# Patient Record
Sex: Female | Born: 1965 | Race: Black or African American | Hispanic: No | Marital: Married | Smoking: Never smoker
Health system: Southern US, Community
[De-identification: ages and names within clinical notes are randomized; demographics above are authoritative.]

## PROBLEM LIST (undated history)

## (undated) ENCOUNTER — Emergency Department (HOSPITAL_COMMUNITY): Admission: EM | Payer: Self-pay | Source: Home / Self Care

## (undated) DIAGNOSIS — I509 Heart failure, unspecified: Secondary | ICD-10-CM

## (undated) DIAGNOSIS — I10 Essential (primary) hypertension: Secondary | ICD-10-CM

## (undated) HISTORY — PX: CHOLECYSTECTOMY: SHX55

## (undated) HISTORY — PX: HERNIA REPAIR: SHX51

---

## 2000-07-25 ENCOUNTER — Other Ambulatory Visit: Admission: RE | Admit: 2000-07-25 | Discharge: 2000-07-25 | Payer: Self-pay | Admitting: Obstetrics

## 2006-09-25 ENCOUNTER — Emergency Department (HOSPITAL_COMMUNITY): Admission: EM | Admit: 2006-09-25 | Discharge: 2006-09-25 | Payer: Self-pay | Admitting: Emergency Medicine

## 2012-03-16 ENCOUNTER — Encounter (HOSPITAL_COMMUNITY): Payer: Self-pay | Admitting: *Deleted

## 2012-03-16 ENCOUNTER — Emergency Department (HOSPITAL_COMMUNITY)
Admission: EM | Admit: 2012-03-16 | Discharge: 2012-03-16 | Disposition: A | Discharge status: Home / Self Care | Payer: Self-pay | Source: Home / Self Care | Attending: Emergency Medicine | Admitting: Emergency Medicine

## 2012-03-16 DIAGNOSIS — I10 Essential (primary) hypertension: Secondary | ICD-10-CM

## 2012-03-16 DIAGNOSIS — R5381 Other malaise: Secondary | ICD-10-CM

## 2012-03-16 DIAGNOSIS — M7918 Myalgia, other site: Secondary | ICD-10-CM

## 2012-03-16 DIAGNOSIS — M545 Low back pain, unspecified: Secondary | ICD-10-CM

## 2012-03-16 DIAGNOSIS — N898 Other specified noninflammatory disorders of vagina: Secondary | ICD-10-CM

## 2012-03-16 DIAGNOSIS — N899 Noninflammatory disorder of vagina, unspecified: Secondary | ICD-10-CM

## 2012-03-16 LAB — POCT URINALYSIS DIP (DEVICE)
Bilirubin Urine: NEGATIVE
Ketones, ur: NEGATIVE mg/dL
Leukocytes, UA: NEGATIVE
Specific Gravity, Urine: 1.02 (ref 1.005–1.030)

## 2012-03-16 LAB — POCT PREGNANCY, URINE: Preg Test, Ur: NEGATIVE

## 2012-03-16 MED ORDER — LISINOPRIL-HYDROCHLOROTHIAZIDE 10-12.5 MG PO TABS: 1.0000 | ORAL_TABLET | Freq: Every day | ORAL | Status: DC

## 2012-03-16 MED ORDER — CYCLOBENZAPRINE HCL 5 MG PO TABS: 10.0000 mg | ORAL_TABLET | Freq: Three times a day (TID) | ORAL | Status: AC | PRN

## 2012-03-16 NOTE — ED Notes (Signed)
Pt with c/o urinary frequency and bilateral back /flank pain intermittently worse in am upon awakening - per pt recently used OTC med for yeast infection had discharge with odor -

## 2012-03-16 NOTE — ED Provider Notes (Signed)
History     CSN: [id number]  Arrival date & time 03/16/12  1215   First MD Initiated Contact with Patient 03/16/12 1449      Chief Complaint  Patient presents with  . Back Pain  . Urinary Frequency    (Consider location/radiation/quality/duration/timing/severity/associated sxs/prior treatment) HPI Comments: Pt with multiple complaints.  Knows bp has been high for a year, but doesn't have a pcp.  Requests anti-htn med.  Also c/o back pain for 3 months, denies injury, worst first thing in the morning, better after she gets up and moving.  Also c/o general malaise for 2-3 months, pt cannot be more specific, just c/o "feeling lousy".  Also c/o recurrent yeast infections, every 2 months or so, had one recently, tx self with otc monistat. Requests referral to [facility] hospital clinic. Pt wonders if has uti because of back pain and frequent vaginal sx.   Patient is a 46 y.o. female presenting with back pain. The history is provided by the patient.  Back Pain  This is a new problem. Episode onset: 3 months ago. The problem occurs daily. The problem has not changed since onset.The pain is associated with no known injury. The pain is present in the lumbar spine. The quality of the pain is described as aching. The pain does not radiate. The pain is mild. Worse during: worst in the morning. Stiffness is present in the morning. Pertinent negatives include no chest pain, no fever, no numbness, no headaches, no dysuria, no paresthesias, no tingling and no weakness. She has tried nothing for the symptoms. Risk factors include obesity and lack of exercise.    History reviewed. No pertinent past medical history.  History reviewed. No pertinent past surgical history.  History reviewed. No pertinent family history.  History  Substance Use Topics  . Smoking status: Never Smoker   . Smokeless tobacco: Not on file  . Alcohol Use: No    OB History    Grav Para Term Preterm Abortions TAB SAB Ect Mult  Living                  Review of Systems  Constitutional: Negative for fever, chills, activity change and appetite change.  Cardiovascular: Negative for chest pain.  Gastrointestinal: Positive for nausea. Negative for vomiting, diarrhea and constipation.       C/o nausea sometimes for last 2 weeks  Genitourinary: Positive for flank pain and vaginal discharge. Negative for dysuria, frequency, hematuria and vaginal bleeding.  Musculoskeletal: Positive for back pain.  Skin: Negative for color change and rash.  Neurological: Negative for tingling, weakness, numbness, headaches and paresthesias.    Allergies  Review of patient's allergies indicates no known allergies.  Home Medications   Current Outpatient Rx  Name Route Sig Dispense Refill  . CYCLOBENZAPRINE HCL 5 MG PO TABS Oral Take 2 tablets (10 mg total) by mouth 3 (three) times daily as needed for muscle spasms. 21 tablet 0  . LISINOPRIL-HYDROCHLOROTHIAZIDE 10-12.5 MG PO TABS Oral Take 1 tablet by mouth daily. 30 tablet 1    BP 170/89  Pulse 78  Temp 99.3 F (37.4 C) (Oral)  Resp 18  SpO2 99%  LMP 02/18/2012  Physical Exam  Constitutional: She appears well-developed and well-nourished. She does not appear ill. No distress.  Cardiovascular: Normal rate and regular rhythm.   Pulmonary/Chest: Effort normal and breath sounds normal.  Abdominal: Soft. Bowel sounds are normal. She exhibits no distension. There is generalized tenderness. There is no rigidity, no rebound,  no guarding and no CVA tenderness.       Mild generalized tender to palp  Genitourinary: There is no rash, tenderness or lesion on the right labia. There is no rash, tenderness or lesion on the left labia. Uterus is enlarged. Uterus is not tender. Cervix exhibits no motion tenderness and no discharge. Right adnexum displays no mass and no tenderness. Left adnexum displays no mass and no tenderness. No erythema, tenderness or bleeding around the vagina. Vaginal  discharge found.  Musculoskeletal:       Thoracic back: Normal.       Lumbar back: She exhibits tenderness. She exhibits normal range of motion, no bony tenderness, no swelling, no edema and no spasm.  Skin: Skin is warm, dry and intact.    ED Course  Procedures (including critical care time)  Labs Reviewed  POCT URINALYSIS DIP (DEVICE) - Abnormal; Notable for the following:    Hgb urine dipstick TRACE (*)     All other components within normal limits  POCT PREGNANCY, URINE  GC/CHLAMYDIA PROBE AMP, GENITAL  WET PREP, GENITAL   No results found.   1. Hypertension   2. Malaise   3. Vaginal irritation   4. Muscle pain, lumbar       MDM  Pt worried about her health, is seeking a pcp.  Multiple c/o.  I can find no source of her malaise, nor her low-grade temp.  Does not appear ill.  Rx lisinopril-hctz. [facility] infections.        Cathlyn Parsons, NP 03/16/12 2132

## 2012-03-19 NOTE — ED Provider Notes (Signed)
Medical screening examination/treatment/procedure(s) were performed by non-physician practitioner and as supervising physician I was immediately available for consultation/collaboration.  Luiz Blare MD   Luiz Blare, MD 03/19/12 [phone]

## 2014-06-14 ENCOUNTER — Encounter (HOSPITAL_COMMUNITY): Payer: Self-pay | Admitting: *Deleted

## 2014-06-14 ENCOUNTER — Inpatient Hospital Stay (HOSPITAL_COMMUNITY)
Admission: AD | Admit: 2014-06-14 | Discharge: 2014-06-14 | Disposition: A | Discharge status: Home / Self Care | Payer: Self-pay | Source: Ambulatory Visit | Attending: Family Medicine | Admitting: Family Medicine

## 2014-06-14 DIAGNOSIS — N898 Other specified noninflammatory disorders of vagina: Secondary | ICD-10-CM | POA: Insufficient documentation

## 2014-06-14 DIAGNOSIS — I10 Essential (primary) hypertension: Secondary | ICD-10-CM | POA: Insufficient documentation

## 2014-06-14 HISTORY — DX: Essential (primary) hypertension: I10

## 2014-06-14 LAB — WET PREP, GENITAL
CLUE CELLS WET PREP: NONE SEEN
TRICH WET PREP: NONE SEEN
Yeast Wet Prep HPF POC: NONE SEEN

## 2014-06-14 LAB — POCT PREGNANCY, URINE: Preg Test, Ur: NEGATIVE

## 2014-06-14 LAB — URINALYSIS, ROUTINE W REFLEX MICROSCOPIC
Bilirubin Urine: NEGATIVE
Glucose, UA: NEGATIVE mg/dL
Ketones, ur: NEGATIVE mg/dL
Leukocytes, UA: NEGATIVE
NITRITE: NEGATIVE
Protein, ur: NEGATIVE mg/dL
SPECIFIC GRAVITY, URINE: 1.025 (ref 1.005–1.030)
UROBILINOGEN UA: 0.2 mg/dL (ref 0.0–1.0)
pH: 5.5 (ref 5.0–8.0)

## 2014-06-14 LAB — URINE MICROSCOPIC-ADD ON

## 2014-06-14 LAB — HIV ANTIBODY (ROUTINE TESTING W REFLEX): HIV 1&2 Ab, 4th Generation: NONREACTIVE

## 2014-06-14 NOTE — MAU Note (Signed)
Patient states she has had a vaginal discharge off and on for 2-3 years. Uses OTC medication and will help for a while. Discharge has an odor, itching and burning.

## 2014-06-14 NOTE — MAU Provider Note (Signed)
History     CSN: [id number]  Arrival date and time: 06/14/14 [id number]   First Provider Initiated Contact with Patient 06/14/14 1153      Chief Complaint  Patient presents with  . Vaginal Discharge   HPI    Ms. Waymon Amatorina Y Gordin is a 48 y.o. female G3P3 who presents with vaginal discharge with an odor, she is also experiencing some vaginal irritation. She caught her husband of 28 years cheating yesterday and wants to be checked out for everything.   OB History    Gravida Para Term Preterm AB TAB SAB Ectopic Multiple Living   3         3      Past Medical History  Diagnosis Date  . Hypertension     Past Surgical History  Procedure Laterality Date  . Cholecystectomy    . Hernia repair      History reviewed. No pertinent family history.  History  Substance Use Topics  . Smoking status: Never Smoker   . Smokeless tobacco: Not on file  . Alcohol Use: No    Allergies: No Known Allergies  Prescriptions prior to admission  Medication Sig Dispense Refill Last Dose  . ibuprofen (ADVIL,MOTRIN) 800 MG tablet Take 800 mg by mouth every 8 (eight) hours as needed for mild pain.   Past Week at Unknown time  . lisinopril-hydrochlorothiazide (PRINZIDE,ZESTORETIC) 10-12.5 MG per tablet Take 1 tablet by mouth every other day. Due a dose today 06-14-14   06/12/2014 at Unknown time  . Norgestimate-Ethinyl Estradiol Triphasic (TRI-SPRINTEC) 0.18/0.215/0.25 MG-35 MCG tablet Take 1 tablet by mouth daily.   06/13/2014 at Unknown time  . lisinopril-hydrochlorothiazide (ZESTORETIC) 10-12.5 MG per tablet Take 1 tablet by mouth daily. (Patient not taking: Reported on 06/14/2014) 30 tablet 1    Results for orders placed or performed during the hospital encounter of 06/14/14 (from the past 48 hour(s))  Urinalysis, Routine w reflex microscopic     Status: Abnormal   Collection Time: 06/14/14  9:55 AM  Result Value Ref Range   Color, Urine YELLOW YELLOW   APPearance CLEAR CLEAR   Specific Gravity, Urine  1.025 1.005 - 1.030   pH 5.5 5.0 - 8.0   Glucose, UA NEGATIVE NEGATIVE mg/dL   Hgb urine dipstick TRACE (A) NEGATIVE   Bilirubin Urine NEGATIVE NEGATIVE   Ketones, ur NEGATIVE NEGATIVE mg/dL   Protein, ur NEGATIVE NEGATIVE mg/dL   Urobilinogen, UA 0.2 0.0 - 1.0 mg/dL   Nitrite NEGATIVE NEGATIVE   Leukocytes, UA NEGATIVE NEGATIVE  Urine microscopic-add on     Status: None   Collection Time: 06/14/14  9:55 AM  Result Value Ref Range   Squamous Epithelial / LPF RARE RARE   WBC, UA 0-2 <3 WBC/hpf   RBC / HPF 0-2 <3 RBC/hpf   Bacteria, UA RARE RARE  Pregnancy, urine POC     Status: None   Collection Time: 06/14/14 10:18 AM  Result Value Ref Range   Preg Test, Ur NEGATIVE NEGATIVE    Comment:        THE SENSITIVITY OF THIS METHODOLOGY IS >24 mIU/mL    Results for orders placed or performed during the hospital encounter of 06/14/14 (from the past 48 hour(s))  Urinalysis, Routine w reflex microscopic     Status: Abnormal   Collection Time: 06/14/14  9:55 AM  Result Value Ref Range   Color, Urine YELLOW YELLOW   APPearance CLEAR CLEAR   Specific Gravity, Urine 1.025 1.005 - 1.030  pH 5.5 5.0 - 8.0   Glucose, UA NEGATIVE NEGATIVE mg/dL   Hgb urine dipstick TRACE (A) NEGATIVE   Bilirubin Urine NEGATIVE NEGATIVE   Ketones, ur NEGATIVE NEGATIVE mg/dL   Protein, ur NEGATIVE NEGATIVE mg/dL   Urobilinogen, UA 0.2 0.0 - 1.0 mg/dL   Nitrite NEGATIVE NEGATIVE   Leukocytes, UA NEGATIVE NEGATIVE  Urine microscopic-add on     Status: None   Collection Time: 06/14/14  9:55 AM  Result Value Ref Range   Squamous Epithelial / LPF RARE RARE   WBC, UA 0-2 <3 WBC/hpf   RBC / HPF 0-2 <3 RBC/hpf   Bacteria, UA RARE RARE  Pregnancy, urine POC     Status: None   Collection Time: 06/14/14 10:18 AM  Result Value Ref Range   Preg Test, Ur NEGATIVE NEGATIVE    Comment:        THE SENSITIVITY OF THIS METHODOLOGY IS >24 mIU/mL   Wet prep, genital     Status: Abnormal   Collection Time: 06/14/14  12:00 PM  Result Value Ref Range   Yeast Wet Prep HPF POC NONE SEEN NONE SEEN   Trich, Wet Prep NONE SEEN NONE SEEN   Clue Cells Wet Prep HPF POC NONE SEEN NONE SEEN   WBC, Wet Prep HPF POC FEW (A) NONE SEEN    Comment: FEW BACTERIA SEEN    Review of Systems  Constitutional: Negative for fever and chills.  Gastrointestinal: Positive for nausea. Negative for vomiting and abdominal pain.  Genitourinary: Negative for dysuria, urgency and frequency.       + vaginal discharge   Physical Exam   Blood pressure 136/75, pulse 92, temperature 98.7 F (37.1 C), temperature source Oral, resp. rate 18, height 5\' 2"  (1.575 m), weight 87.272 kg (192 lb 6.4 oz), last menstrual period 05/31/2014, SpO2 99 %.  Physical Exam  Constitutional: She is oriented to person, place, and time. She appears well-developed and well-nourished. No distress.  HENT:  Head: Normocephalic.  Eyes: Pupils are equal, round, and reactive to light.  Neck: Neck supple.  Respiratory: Effort normal.  GI: Soft. She exhibits no distension. There is no tenderness. There is no rebound.  Genitourinary:  Speculum exam: Vagina - Small amount of creamy. Mucoid discharge, no odor Cervix - No contact bleeding, no active bleeding  Bimanual exam: Cervix closed, no CMT  Uterus non tender, normal size Adnexa non tender, no masses bilaterally GC/Chlam, wet prep done Chaperone present for exam.   Neurological: She is alert and oriented to person, place, and time.  Skin: Skin is warm. She is not diaphoretic.  Psychiatric: Her behavior is normal.    MAU Course  Procedures. None  MDM UA Upt Wet prep GC HIV   Assessment and Plan   A: 1. Vaginal discharge    P: Discharge home in stable condition GC pending Condoms always Support offered  HIV pending   Iona HansenJennifer Irene Niharika Savino, NP 06/14/2014 4:02 PM

## 2014-06-15 LAB — GC/CHLAMYDIA PROBE AMP
CT Probe RNA: NEGATIVE
GC Probe RNA: NEGATIVE

## 2015-04-05 ENCOUNTER — Other Ambulatory Visit: Payer: Self-pay | Admitting: Internal Medicine

## 2015-05-06 ENCOUNTER — Other Ambulatory Visit: Payer: Self-pay | Admitting: Internal Medicine

## 2015-05-06 DIAGNOSIS — Z1231 Encounter for screening mammogram for malignant neoplasm of breast: Secondary | ICD-10-CM

## 2015-05-13 ENCOUNTER — Ambulatory Visit
Admission: RE | Admit: 2015-05-13 | Discharge: 2015-05-13 | Disposition: A | Discharge status: Home / Self Care | Payer: No Typology Code available for payment source | Source: Ambulatory Visit | Attending: Internal Medicine | Admitting: Internal Medicine

## 2015-05-13 DIAGNOSIS — Z1231 Encounter for screening mammogram for malignant neoplasm of breast: Secondary | ICD-10-CM

## 2015-05-17 ENCOUNTER — Other Ambulatory Visit: Payer: Self-pay | Admitting: Internal Medicine

## 2015-05-17 DIAGNOSIS — R928 Other abnormal and inconclusive findings on diagnostic imaging of breast: Secondary | ICD-10-CM

## 2015-05-25 ENCOUNTER — Ambulatory Visit
Admission: RE | Admit: 2015-05-25 | Discharge: 2015-05-25 | Disposition: A | Discharge status: Home / Self Care | Payer: No Typology Code available for payment source | Source: Ambulatory Visit | Attending: Internal Medicine | Admitting: Internal Medicine

## 2015-05-25 DIAGNOSIS — R928 Other abnormal and inconclusive findings on diagnostic imaging of breast: Secondary | ICD-10-CM

## 2016-07-13 ENCOUNTER — Other Ambulatory Visit: Payer: Self-pay | Admitting: Internal Medicine

## 2016-07-13 DIAGNOSIS — Z1231 Encounter for screening mammogram for malignant neoplasm of breast: Secondary | ICD-10-CM

## 2016-07-17 IMAGING — US US BREAST LTD UNI LEFT INC AXILLA
1 series · 4 of 4 positions shown · non-contrast
Comparison: 05/13/2015 baseline screening mammogram

CLINICAL DATA: 48-year-old female with possible left breast mass on
screening mammogram.

EXAM:
DIGITAL DIAGNOSTIC LEFT MAMMOGRAM WITH 3D TOMOSYNTHESIS WITH CAD
ULTRASOUND LEFT BREAST

[Series 1: advbreast · 4 of 4 slices shown]
[im 1/4]
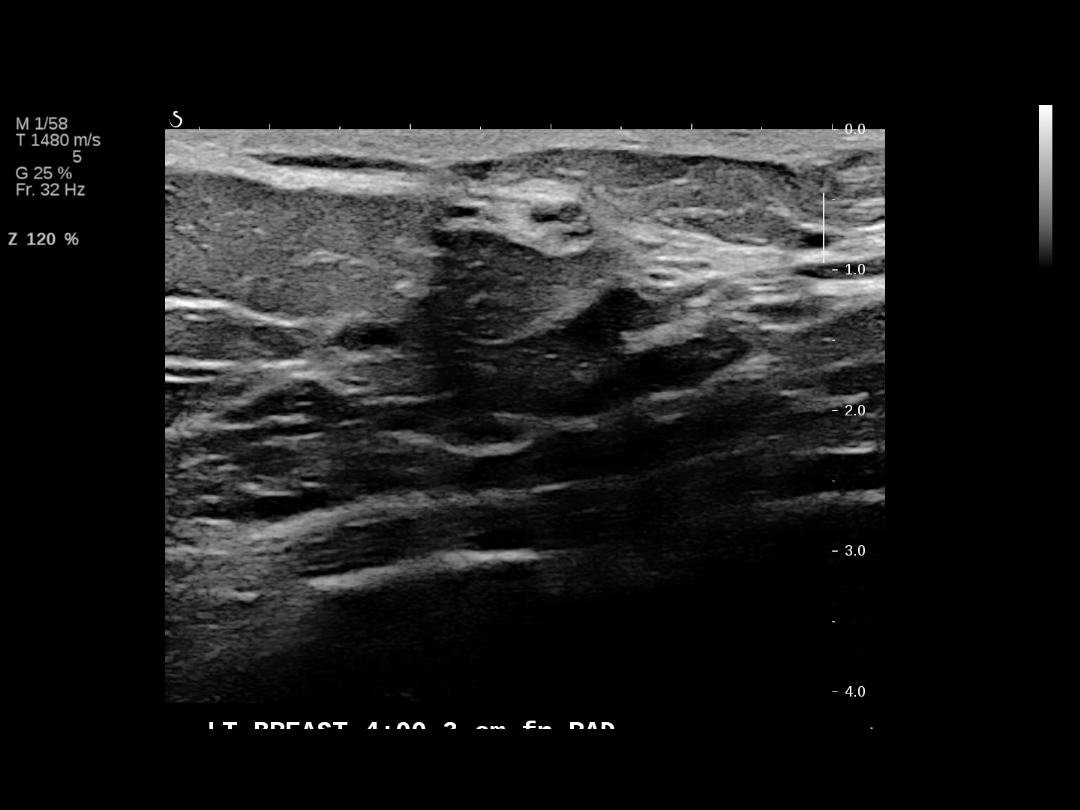
[im 2/4]
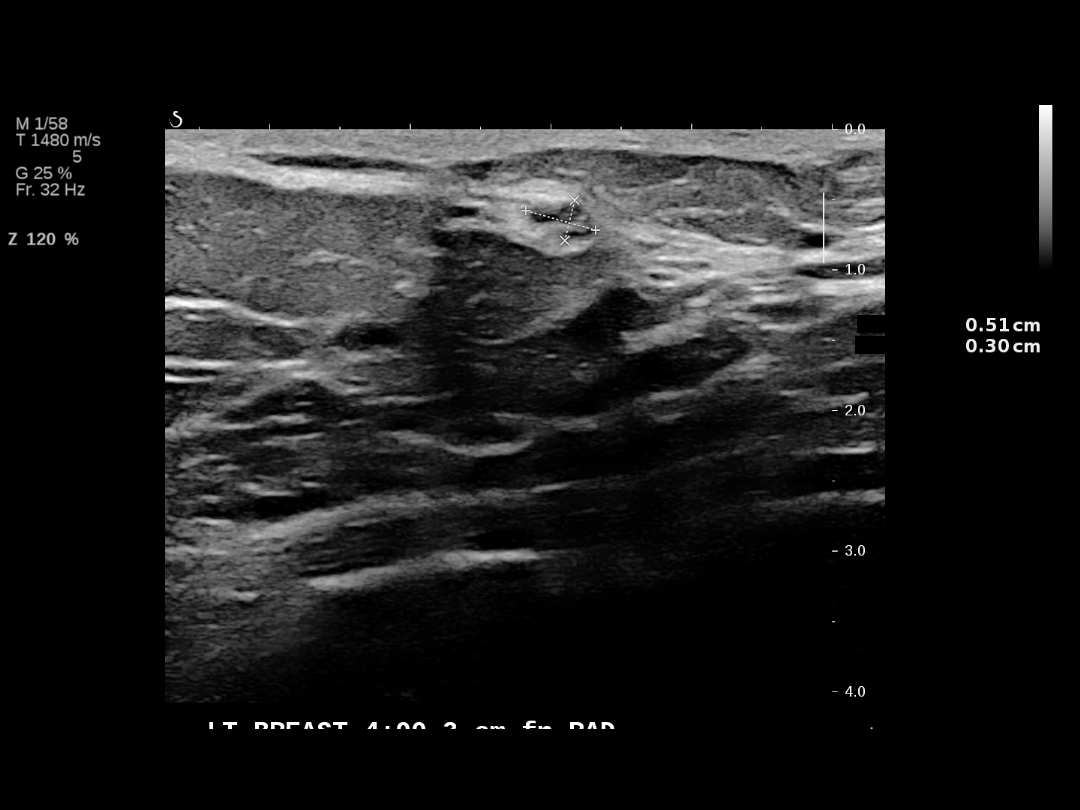
[im 3/4]
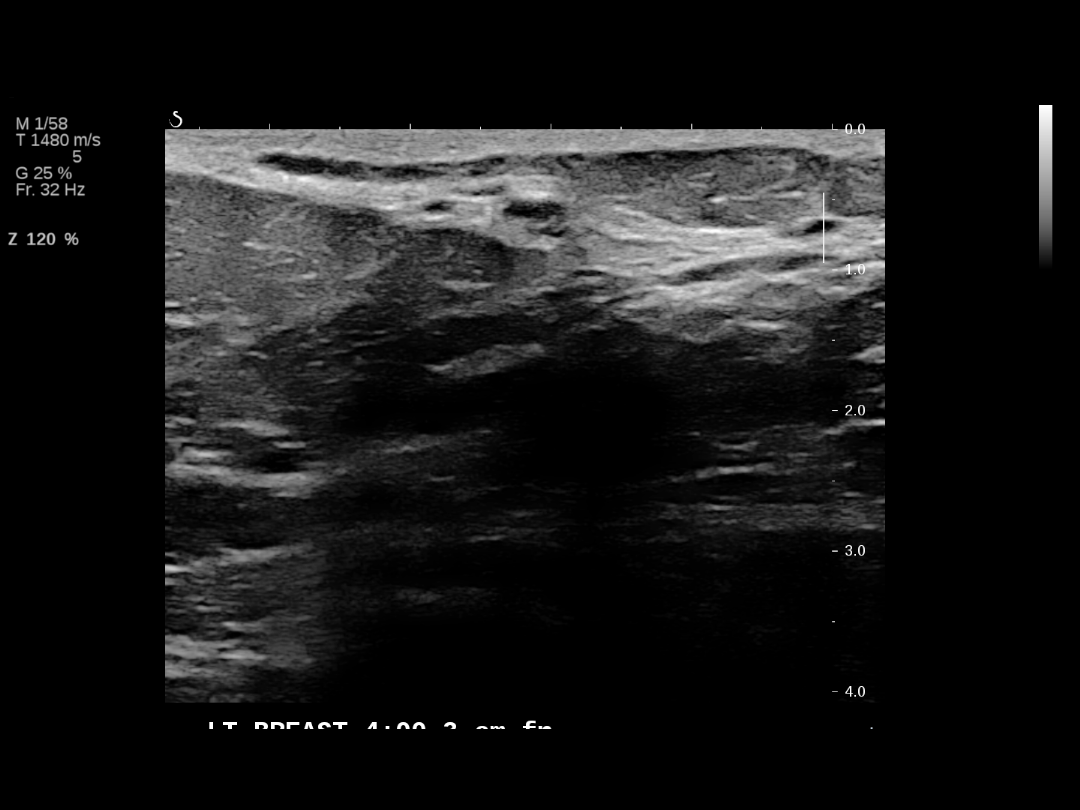
[im 4/4]
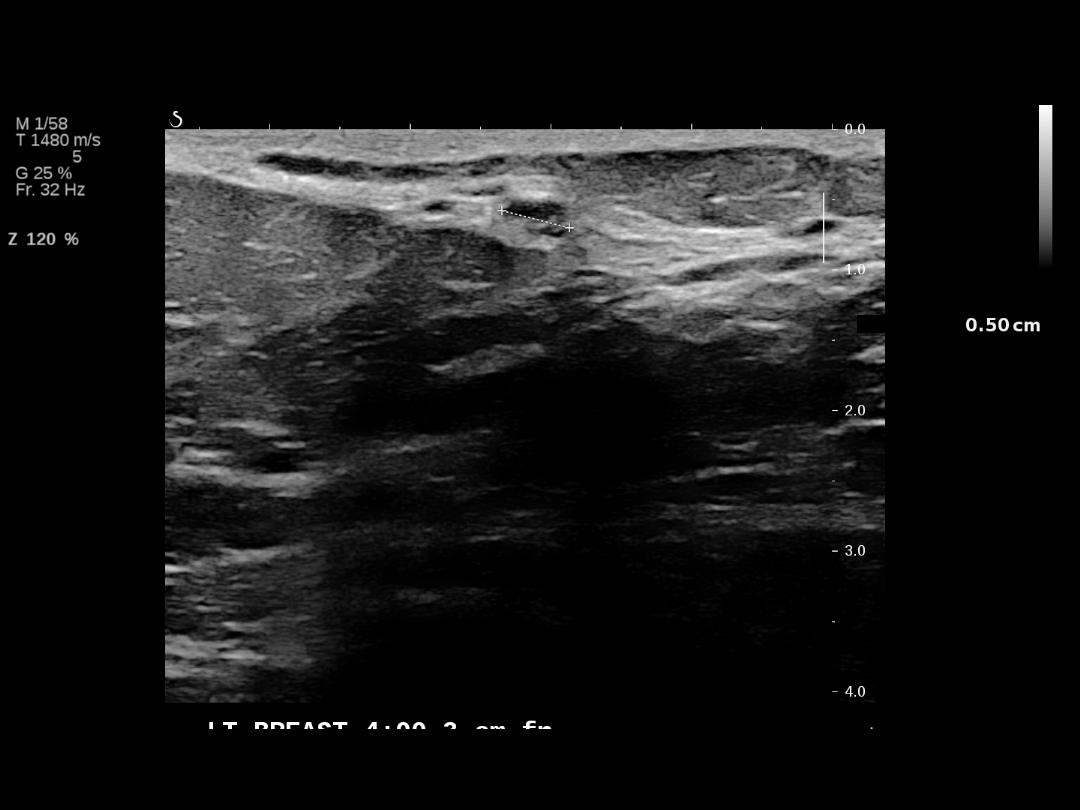

[4 of 4 positions shown; findings below may reference images not displayed]

ACR Breast Density Category b: There are scattered areas of
fibroglandular density.
FINDINGS: 2D and 3D views of the left breast demonstrate a circumscribed oval
low-density mass with possible fatty hilum within the lower outer
left breast.

No other mass, distortion or worrisome calcifications are
identified.

Mammographic images were processed with CAD.

On physical exam, no palpable abnormalities are identified within
the outer or lower left breast.

Targeted ultrasound is performed, showing a 5 x 3 x 5 mm normal
intraparenchymal lymph node at the 4 o'clock position of the left
breast 3 cm from the nipple, corresponding to the screening study
finding. No other solid or cystic mass, distortion or abnormal areas
of shadowing are identified within the outer or lower left breast.
IMPRESSION: Benign intraparenchymal lymph node in the outer slightly lower left
breast corresponding to the screening study finding.

No other mammographic abnormalities identified.

RECOMMENDATION:
Bilateral screening mammograms in 1 year.

I have discussed the findings and recommendations with the patient.
Results were also provided in writing at the conclusion of the
visit. If applicable, a reminder letter will be sent to the patient
regarding the next appointment.

BI-RADS CATEGORY  2: Benign.

## 2016-08-15 ENCOUNTER — Ambulatory Visit
Admission: RE | Admit: 2016-08-15 | Discharge: 2016-08-15 | Disposition: A | Discharge status: Home / Self Care | Payer: No Typology Code available for payment source | Source: Ambulatory Visit | Attending: Internal Medicine | Admitting: Internal Medicine

## 2016-08-15 DIAGNOSIS — Z1231 Encounter for screening mammogram for malignant neoplasm of breast: Secondary | ICD-10-CM

## 2018-11-07 ENCOUNTER — Emergency Department (HOSPITAL_COMMUNITY): Payer: Self-pay

## 2018-11-07 ENCOUNTER — Encounter (HOSPITAL_COMMUNITY): Payer: Self-pay | Admitting: Emergency Medicine

## 2018-11-07 ENCOUNTER — Other Ambulatory Visit: Payer: Self-pay

## 2018-11-07 ENCOUNTER — Emergency Department (HOSPITAL_COMMUNITY)
Admission: EM | Admit: 2018-11-07 | Discharge: 2018-11-07 | Disposition: A | Discharge status: Home / Self Care | Payer: Self-pay | Attending: Emergency Medicine | Admitting: Emergency Medicine

## 2018-11-07 DIAGNOSIS — W010XXA Fall on same level from slipping, tripping and stumbling without subsequent striking against object, initial encounter: Secondary | ICD-10-CM | POA: Insufficient documentation

## 2018-11-07 DIAGNOSIS — I10 Essential (primary) hypertension: Secondary | ICD-10-CM | POA: Insufficient documentation

## 2018-11-07 DIAGNOSIS — Y929 Unspecified place or not applicable: Secondary | ICD-10-CM | POA: Insufficient documentation

## 2018-11-07 DIAGNOSIS — Y9389 Activity, other specified: Secondary | ICD-10-CM | POA: Insufficient documentation

## 2018-11-07 DIAGNOSIS — Y998 Other external cause status: Secondary | ICD-10-CM | POA: Insufficient documentation

## 2018-11-07 DIAGNOSIS — S52502A Unspecified fracture of the lower end of left radius, initial encounter for closed fracture: Secondary | ICD-10-CM | POA: Insufficient documentation

## 2018-11-07 DIAGNOSIS — Z79899 Other long term (current) drug therapy: Secondary | ICD-10-CM | POA: Insufficient documentation

## 2018-11-07 MED ORDER — HYDROCODONE-ACETAMINOPHEN 5-325 MG PO TABS: 1.0000 | ORAL_TABLET | Freq: Four times a day (QID) | ORAL | 0 refills | Status: DC | PRN

## 2018-11-07 MED ORDER — ONDANSETRON HCL 4 MG PO TABS: 4.0000 mg | ORAL_TABLET | Freq: Four times a day (QID) | ORAL | 0 refills | Status: DC

## 2018-11-07 NOTE — ED Notes (Signed)
Patient verbalizes understanding of discharge instructions. Opportunity for questioning and answers were provided. Armband removed by staff, pt discharged from ED. Pt taken home by family.  

## 2018-11-07 NOTE — ED Notes (Signed)
Family contact information: [phone] (Son)

## 2018-11-07 NOTE — ED Notes (Signed)
Ortho paged for splint 

## 2018-11-07 NOTE — ED Triage Notes (Signed)
Pt arrives with c/o of left wrist after falling off of deck while working with bricks. Pt has pain and swelling to left wrist.

## 2018-11-07 NOTE — ED Provider Notes (Signed)
[facility] EMERGENCY DEPARTMENT Provider Note   CSN: [id number] Arrival date & time: 11/07/18  1635    History   Chief Complaint Chief Complaint  Patient presents with  . Wrist Injury    HPI Dana Mcintyre is a 53 y.o. female.     HPI Patient presents after fall.  Was working on paper brakes when she was standing on 1 to compress it and slipped and fell landing on her left wrist.  Also landed on her right buttock.  Complaining mostly pain in her left wrist.  States she felt like if she is going to pass out after the pain but feels much better now.  No numbness or weakness.  No elbow pain.  No head or neck pain.  Not on anticoagulation.  Otherwise healthy. Past Medical History:  Diagnosis Date  . Hypertension     There are no active problems to display for this patient.   Past Surgical History:  Procedure Laterality Date  . CHOLECYSTECTOMY    . HERNIA REPAIR       OB History    Gravida  3   Para  3   Term      Preterm      AB      Living  3     SAB      TAB      Ectopic      Multiple      Live Births               Home Medications    Prior to Admission medications   Medication Sig Start Date End Date Taking? Authorizing Provider  HYDROcodone-acetaminophen (NORCO/VICODIN) 5-325 MG tablet Take 1-2 tablets by mouth every 6 (six) hours as needed. 11/07/18   Benjiman Core, MD  ibuprofen (ADVIL,MOTRIN) 800 MG tablet Take 800 mg by mouth every 8 (eight) hours as needed for mild pain.    [provider]  lisinopril-hydrochlorothiazide (PRINZIDE,ZESTORETIC) 10-12.5 MG per tablet Take 1 tablet by mouth every other day. Due a dose today 06-14-14    [provider]  lisinopril-hydrochlorothiazide (ZESTORETIC) 10-12.5 MG per tablet Take 1 tablet by mouth daily. Patient not taking: Reported on 06/14/2014 03/16/12 03/16/13  Cathlyn Parsons, NP  Norgestimate-Ethinyl Estradiol Triphasic (TRI-SPRINTEC) 0.18/0.215/0.25 MG-35 MCG  tablet Take 1 tablet by mouth daily.    [provider]    Family History No family history on file.  Social History Social History   Tobacco Use  . Smoking status: Never Smoker  Substance Use Topics  . Alcohol use: No  . Drug use: No     Allergies   Patient has no known allergies.   Review of Systems Review of Systems  Constitutional: Negative for appetite change.  Respiratory: Negative for shortness of breath.   Cardiovascular: Negative for chest pain.  Gastrointestinal: Negative for abdominal distention.  Genitourinary: Negative for flank pain.  Musculoskeletal:       Left wrist pain.  Mild right low back pain.  Skin: Negative for wound.  Neurological: Negative for weakness and numbness.  Hematological: Negative for adenopathy.  Psychiatric/Behavioral: Negative for confusion.     Physical Exam Updated Vital Signs BP (!) 154/93 (BP Location: Right Arm)   Pulse 72   Temp 98.6 F (37 C) (Oral)   Resp 16   SpO2 100%   Physical Exam Vitals signs and nursing note reviewed.  HENT:     Head: Atraumatic.  Eyes:     Pupils:  Pupils are equal, round, and reactive to light.  Neck:     Musculoskeletal: Neck supple.     Comments: No midline tenderness.  Painless range of motion. Cardiovascular:     Rate and Rhythm: Normal rate.  Pulmonary:     Effort: No respiratory distress.  Abdominal:     Tenderness: There is no abdominal tenderness.  Musculoskeletal:        General: Tenderness present.     Comments: Tenderness over left wrist over the distal radius.  No elbow tenderness.  Good range of motion in the elbow.  No shoulder tenderness.  Neurovascular intact over radial median and ulnar distributions in the hand.  Strong radial pulse.  Skin:    Capillary Refill: Capillary refill takes less than 2 seconds.  Neurological:     General: No focal deficit present.     Mental Status: She is alert.      ED Treatments / Results  Labs (all labs ordered are  listed, but only abnormal results are displayed) Labs Reviewed - No data to display  EKG None  Radiology Dg Wrist Complete Left  Result Date: 11/07/2018 CLINICAL DATA:  Larey Seat with pain and deformity. EXAM: LEFT WRIST - COMPLETE 3+ VIEW COMPARISON:  None. FINDINGS: Comminuted fracture of the distal radial metaphysis but without significant angulation or displacement. Distal radial articular surface retains a slight volar tilt. No fracture of the ulna or carpal bones. The fracture line does not visibly extend to the articular surface. IMPRESSION: Mildly comminuted distal radial metaphyseal fracture. Fracture line does not visibly extend to the articular surface. Distal radial articular surface retains slight volar tilt. Electronically Signed   By: Paulina Fusi M.D.   On: 11/07/2018 17:22    Procedures Procedures (including critical care time)  Medications Ordered in ED Medications - No data to display   Initial Impression / Assessment and Plan / ED Course  I have reviewed the triage vital signs and the nursing notes.  Pertinent labs & imaging results that were available during my care of the patient were reviewed by me and considered in my medical decision making (see chart for details).       Patient with fall with distal radius fracture.  Is somewhat comminuted but appears well aligned.  Will splint with sugar tong splint and have follow-up with hand surgery.  Closed.  Final Clinical Impressions(s) / ED Diagnoses   Final diagnoses:  Closed fracture of distal end of left radius, unspecified fracture morphology, initial encounter    ED Discharge Orders         Ordered    HYDROcodone-acetaminophen (NORCO/VICODIN) 5-325 MG tablet  Every 6 hours PRN     11/07/18 1747           Benjiman Core, MD 11/07/18 1754

## 2018-11-07 NOTE — Progress Notes (Signed)
Orthopedic Tech Progress Note Patient Details:  Dana Mcintyre [birth year] [id number]  Ortho Devices Type of Ortho Device: Arm sling, Sugartong splint Ortho Device/Splint Location: lue Ortho Device/Splint Interventions: Ordered, Application, Adjustment   Post Interventions Patient Tolerated: Well Instructions Provided: Care of device, Adjustment of device   Trinna Post 11/07/2018, 7:13 PM

## 2018-11-07 NOTE — ED Notes (Signed)
Ortho at the bedside.

## 2019-10-16 ENCOUNTER — Ambulatory Visit: Payer: Self-pay

## 2020-01-19 ENCOUNTER — Other Ambulatory Visit: Payer: Self-pay | Admitting: Family Medicine

## 2020-01-19 ENCOUNTER — Other Ambulatory Visit (HOSPITAL_COMMUNITY)
Admission: RE | Admit: 2020-01-19 | Discharge: 2020-01-19 | Disposition: A | Discharge status: Home / Self Care | Payer: Self-pay | Source: Ambulatory Visit | Attending: Family Medicine | Admitting: Family Medicine

## 2020-01-19 DIAGNOSIS — Z124 Encounter for screening for malignant neoplasm of cervix: Secondary | ICD-10-CM | POA: Insufficient documentation

## 2020-01-21 LAB — CYTOLOGY - PAP
Comment: NEGATIVE
Diagnosis: NEGATIVE
High risk HPV: NEGATIVE

## 2020-03-06 DIAGNOSIS — E852 Heredofamilial amyloidosis, unspecified: Secondary | ICD-10-CM

## 2020-03-06 HISTORY — DX: Heredofamilial amyloidosis, unspecified: E85.2

## 2020-04-22 ENCOUNTER — Telehealth (HOSPITAL_COMMUNITY): Payer: Self-pay | Admitting: *Deleted

## 2020-04-22 DIAGNOSIS — Z1589 Genetic susceptibility to other disease: Secondary | ICD-10-CM

## 2020-04-22 NOTE — Telephone Encounter (Signed)
Received referral from [facility]. Pt's father is a pt of Dr Alford Highland and has ATTR, pt also tested + TTR.  Per Dr Shirlee Latch pt needs echo and appt with him, order placed will sch

## 2020-06-10 ENCOUNTER — Encounter (HOSPITAL_COMMUNITY): Payer: Self-pay | Admitting: Cardiology

## 2020-06-10 ENCOUNTER — Ambulatory Visit (HOSPITAL_BASED_OUTPATIENT_CLINIC_OR_DEPARTMENT_OTHER)
Admission: RE | Admit: 2020-06-10 | Discharge: 2020-06-10 | Disposition: A | Discharge status: Home / Self Care | Payer: Self-pay | Source: Ambulatory Visit | Attending: Cardiology | Admitting: Cardiology

## 2020-06-10 ENCOUNTER — Ambulatory Visit (HOSPITAL_COMMUNITY)
Admission: RE | Admit: 2020-06-10 | Discharge: 2020-06-10 | Disposition: A | Discharge status: Home / Self Care | Payer: Self-pay | Source: Ambulatory Visit | Attending: Cardiology | Admitting: Cardiology

## 2020-06-10 ENCOUNTER — Other Ambulatory Visit: Payer: Self-pay

## 2020-06-10 VITALS — BP 130/80 | HR 57 | Wt 189.0 lb

## 2020-06-10 DIAGNOSIS — I110 Hypertensive heart disease with heart failure: Secondary | ICD-10-CM | POA: Insufficient documentation

## 2020-06-10 DIAGNOSIS — Z1589 Genetic susceptibility to other disease: Secondary | ICD-10-CM

## 2020-06-10 DIAGNOSIS — I509 Heart failure, unspecified: Secondary | ICD-10-CM | POA: Insufficient documentation

## 2020-06-10 DIAGNOSIS — E785 Hyperlipidemia, unspecified: Secondary | ICD-10-CM | POA: Insufficient documentation

## 2020-06-10 DIAGNOSIS — Z79899 Other long term (current) drug therapy: Secondary | ICD-10-CM | POA: Insufficient documentation

## 2020-06-10 DIAGNOSIS — I10 Essential (primary) hypertension: Secondary | ICD-10-CM

## 2020-06-10 HISTORY — DX: Heart failure, unspecified: I50.9

## 2020-06-10 LAB — BASIC METABOLIC PANEL
Anion gap: 5 (ref 5–15)
BUN: 15 mg/dL (ref 6–20)
CO2: 25 mmol/L (ref 22–32)
Calcium: 9.9 mg/dL (ref 8.9–10.3)
Chloride: 111 mmol/L (ref 98–111)
Creatinine, Ser: 1.46 mg/dL — ABNORMAL HIGH (ref 0.44–1.00)
GFR, Estimated: 43 mL/min — ABNORMAL LOW (ref >60)
Glucose, Bld: 101 mg/dL — ABNORMAL HIGH (ref 70–99)
Potassium: 3.8 mmol/L (ref 3.5–5.1)
Sodium: 141 mmol/L (ref 135–145)

## 2020-06-10 LAB — ECHOCARDIOGRAM COMPLETE
Area-P 1/2: 3.08 cm2
S' Lateral: 2.5 cm

## 2020-06-10 NOTE — Patient Instructions (Addendum)
Your physician recommends that you schedule a follow-up appointment in: November 2023 with an echocardiogram  Your physician has requested that you have an echocardiogram. Echocardiography is a painless test that uses sound waves to create images of your heart. It provides your doctor with information about the size and shape of your heart and how well your heart's chambers and valves are working. This procedure takes approximately one hour. There are no restrictions for this procedure.   Labs done today, your results will be available in MyChart, we will contact you for abnormal readings.  If you have any questions or concerns before your next appointment please send [place] a message through [place] or call our office at [phone].    TO LEAVE A MESSAGE FOR THE NURSE SELECT OPTION 2, PLEASE LEAVE A MESSAGE INCLUDING: . YOUR NAME . DATE OF BIRTH . CALL BACK NUMBER . REASON FOR CALL**this is important as we prioritize the call backs  YOU WILL RECEIVE A CALL BACK THE SAME DAY AS LONG AS YOU CALL BEFORE 4:00 PM

## 2020-06-10 NOTE — Progress Notes (Signed)
Echocardiogram 2D Echocardiogram has been performed.  Warren Lacy Aayan Haskew 06/10/2020, 10:50 AM

## 2020-06-12 NOTE — Progress Notes (Signed)
PCP: Aliene Beams, MD Cardiology: Dr. Shirlee Latch  54 y.o. 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  Patient's father had hereditary TTR cardiac amyloidosis and was a heterozygote for Val142Ile.  Patient has been doing well symptomatically.  Her BP is controlled on amlodipine.  She jogs for about 1 hr/day and has been working on losing weight.  No chest pain.  No significant exertional dyspnea.  She does not have tingling or numbness in hands or feet.  No lightheadedness or palpitations.   Echo was done today and reviewed, EF 60-65% with normal diastolic function and strain, no LVH.   ECG (personally reviewed): NSR, normal.   PMH: 1. HTN: Angioedema with lisinopril.  2. Depression 3. Hyperlipidemia 4. 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 - Echo (11/21): EF 60-65%, no LVH, GLS -18.7%, normal diastolic function, normal RV.   Family history: Father with genetic TTR cardiac amyloidosis.   Social History   Socioeconomic History  . Marital status: Married    Spouse name: Not on file  . Number of children: Not on file  . Years of education: Not on file  . Highest education level: Not on file  Occupational History  . Not on file  Tobacco Use  . Smoking status: Never Smoker  Substance and Sexual Activity  . Alcohol use: No  . Drug use: No  . Sexual activity: Yes    Birth control/protection: Pill  Other Topics Concern  . Not on file  Social History Narrative  . Not on file   Social Determinants of Health   Financial Resource Strain:   . Difficulty of Paying Living Expenses: Not on file  Food Insecurity:   . Worried About Programme researcher, broadcasting/film/video in the Last Year: Not on file  . Ran Out of Food in the Last Year: Not on file  Transportation Needs:   . Lack of Transportation (Medical): Not on file  . Lack of Transportation (Non-Medical): Not on file  Physical Activity:   . Days of Exercise per Week: Not on file  .  Minutes of Exercise per Session: Not on file  Stress:   . Feeling of Stress : Not on file  Social Connections:   . Frequency of Communication with Friends and Family: Not on file  . Frequency of Social Gatherings with Friends and Family: Not on file  . Attends Religious Services: Not on file  . Active Member of Clubs or Organizations: Not on file  . Attends Banker Meetings: Not on file  . Marital Status: Not on file  Intimate Partner Violence:   . Fear of Current or Ex-Partner: Not on file  . Emotionally Abused: Not on file  . Physically Abused: Not on file  . Sexually Abused: Not on file   ROS: All systems reviewed and negative except as per HPI.   Current Outpatient Medications  Medication Sig Dispense Refill  . amLODipine (NORVASC) 10 MG tablet Take 10 mg by mouth daily. 1/2 tab    . atorvastatin (LIPITOR) 20 MG tablet Take 20 mg by mouth at bedtime.    Marland Kitchen escitalopram (LEXAPRO) 5 MG tablet Take 5 mg by mouth daily.    . Multiple Vitamin (MULTIVITAMIN) tablet Take 1 tablet by mouth daily.    . Multiple Vitamins-Minerals (HAIR SKIN AND NAILS FORMULA) TABS Take 1 capsule by mouth daily.    . Probiotic Product (PROBIOTIC DAILY PO) Take 1 capsule by mouth.    Marland Kitchen  Wheat Dextrin (BENEFIBER DRINK MIX PO) Take by mouth.     No current facility-administered medications for this encounter.   BP 130/80   Pulse (!) 57   Wt 85.7 kg (189 lb)   SpO2 97%   BMI 34.57 kg/m  General: NAD Neck: No JVD, no thyromegaly or thyroid nodule.  Lungs: Clear to auscultation bilaterally with normal respiratory effort. CV: Nondisplaced PMI.  Heart regular S1/S2, no S3/S4, no murmur.  No peripheral edema.  No carotid bruit.  Normal pedal pulses.  Abdomen: Soft, nontender, no hepatosplenomegaly, no distention.  Skin: Intact without lesions or rashes.  Neurologic: Alert and oriented x 3.  Psych: Normal affect. Extremities: No clubbing or cyanosis.  HEENT: Normal.   1. 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  Echo done today was normal and ECG done today was normal. She has no symptoms of neuropathy.  No significant exertion dyspnea.   - She has an appointment with neurology to assess for neuropathy.  - BMET to assess creatinine.  - She is doing well without signs of active amyloidosis.  In the absence of concerning symptoms, would repeat echo and ECG in 2 yrs.  2. HTN: BP is well-controlled on amlodipine, continue.   Marca Ancona 06/12/2020

## 2020-06-17 ENCOUNTER — Ambulatory Visit: Payer: Self-pay | Admitting: Neurology

## 2020-06-20 ENCOUNTER — Ambulatory Visit: Payer: Self-pay | Admitting: Neurology

## 2020-08-30 NOTE — Progress Notes (Deleted)
[facility]    Provider:  Dr Lucia Gaskins Requesting Provider: Aliene Beams, MD Primary Care Provider:  Aliene Beams, MD  CC:  ***  HPI:  Dana Mcintyre is a 55 y.o. female here as requested by Aliene Beams, MD for familial amyloidosis.  Past medical history depression, hypertension, anemia, obesity.  They want [place] to evaluate and treat due to history of genetic trait of amyloidosis.  I reviewed Dr. Malena Peer notes: She has been treated for high blood pressure and abnormal cholesterol, patient had genetic testing done but has not yet received the report, she is to speak with the genetic counselor prior to testing, she was seen by GI,  Reviewed notes, labs and imaging from outside physicians, which showed ***  Review of Systems: Patient complains of symptoms per HPI as well as the following symptoms ***. Pertinent negatives and positives per HPI. All others negative.   Social History   Socioeconomic History  . Marital status: Married    Spouse name: Not on file  . Number of children: Not on file  . Years of education: Not on file  . Highest education level: Not on file  Occupational History  . Not on file  Tobacco Use  . Smoking status: Never Smoker  . Smokeless tobacco: Not on file  Substance and Sexual Activity  . Alcohol use: No  . Drug use: No  . Sexual activity: Yes    Birth control/protection: Pill  Other Topics Concern  . Not on file  Social History Narrative  . Not on file   Social Determinants of Health   Financial Resource Strain: Not on file  Food Insecurity: Not on file  Transportation Needs: Not on file  Physical Activity: Not on file  Stress: Not on file  Social Connections: Not on file  Intimate Partner Violence: Not on file    No family history on file.  Past Medical History:  Diagnosis Date  . 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 CHF (congestive heart failure) (HCC)   . Hypertension     There are no problems to display  for this patient.   Past Surgical History:  Procedure Laterality Date  . CHOLECYSTECTOMY    . HERNIA REPAIR      Current Outpatient Medications  Medication Sig Dispense Refill  . amLODipine (NORVASC) 10 MG tablet Take 10 mg by mouth daily. 1/2 tab    . atorvastatin (LIPITOR) 20 MG tablet Take 20 mg by mouth at bedtime.    Marland Kitchen escitalopram (LEXAPRO) 5 MG tablet Take 5 mg by mouth daily.    . Multiple Vitamin (MULTIVITAMIN) tablet Take 1 tablet by mouth daily.    . Multiple Vitamins-Minerals (HAIR SKIN AND NAILS FORMULA) TABS Take 1 capsule by mouth daily.    . Probiotic Product (PROBIOTIC DAILY PO) Take 1 capsule by mouth.    . Wheat Dextrin (BENEFIBER DRINK MIX PO) Take by mouth.     No current facility-administered medications for this visit.    Allergies as of 08/31/2020 - Review Complete 06/12/2020  Allergen Reaction Noted  . Lisinopril Swelling 06/10/2020    Vitals: There were no vitals taken for this visit. Last Weight:  Wt Readings from Last 1 Encounters:  06/10/20 189 lb (85.7 kg)   Last Height:   Ht Readings from Last 1 Encounters:  06/14/14 5\' 2"  (1.575 m)     Physical exam: Exam: Gen: NAD, conversant, well nourised, obese, well groomed  CV: RRR, no MRG. No Carotid Bruits. No peripheral edema, warm, nontender Eyes: Conjunctivae clear without exudates or hemorrhage  Neuro: Detailed Neurologic Exam  Speech:    Speech is normal; fluent and spontaneous with normal comprehension.  Cognition:    The patient is oriented to person, place, and time;     recent and remote memory intact;     language fluent;     normal attention, concentration,     fund of knowledge Cranial Nerves:    The pupils are equal, round, and reactive to light. The fundi are normal and spontaneous venous pulsations are present. Visual fields are full to finger confrontation. Extraocular movements are intact. Trigeminal sensation is intact and the muscles of mastication  are normal. The face is symmetric. The palate elevates in the midline. Hearing intact. Voice is normal. Shoulder shrug is normal. The tongue has normal motion without fasciculations.   Coordination:    Normal finger to nose and heel to shin. Normal rapid alternating movements.   Gait:    Heel-toe and tandem gait are normal.   Motor Observation:    No asymmetry, no atrophy, and no involuntary movements noted. Tone:    Normal muscle tone.    Posture:    Posture is normal. normal erect    Strength:    Strength is V/V in the upper and lower limbs.      Sensation: intact to LT     Reflex Exam:  DTR's:    Deep tendon reflexes in the upper and lower extremities are normal bilaterally.   Toes:    The toes are downgoing bilaterally.   Clonus:    Clonus is absent.    Assessment/Plan:    No orders of the defined types were placed in this encounter.  No orders of the defined types were placed in this encounter.   Cc: Aliene Beams, MD,  Aliene Beams, MD  Naomie Dean, MD  [facility] [address] [place], [place] [address]  Phone [phone] Fax [phone]

## 2020-08-31 ENCOUNTER — Encounter: Payer: Self-pay | Admitting: Neurology

## 2020-08-31 ENCOUNTER — Ambulatory Visit: Payer: Self-pay | Admitting: Neurology

## 2021-01-09 ENCOUNTER — Other Ambulatory Visit: Payer: Self-pay | Admitting: Internal Medicine

## 2021-01-09 ENCOUNTER — Other Ambulatory Visit: Payer: Self-pay | Admitting: Family Medicine

## 2021-01-09 DIAGNOSIS — N281 Cyst of kidney, acquired: Secondary | ICD-10-CM

## 2021-01-09 DIAGNOSIS — N1832 Chronic kidney disease, stage 3b: Secondary | ICD-10-CM

## 2021-01-12 ENCOUNTER — Ambulatory Visit
Admission: RE | Admit: 2021-01-12 | Discharge: 2021-01-12 | Disposition: A | Discharge status: Home / Self Care | Payer: Self-pay | Source: Ambulatory Visit | Attending: Internal Medicine | Admitting: Internal Medicine

## 2021-01-12 DIAGNOSIS — N1832 Chronic kidney disease, stage 3b: Secondary | ICD-10-CM

## 2022-03-07 IMAGING — US US RENAL
1 series · 14 of 25 positions shown · non-contrast
Comparison: None.

CLINICAL DATA: Chronic renal disease

EXAM:
RENAL / URINARY TRACT ULTRASOUND COMPLETE

[Series 1: us renal · 0.18mm/px · 14 of 34 slices shown]
[im 1/34]
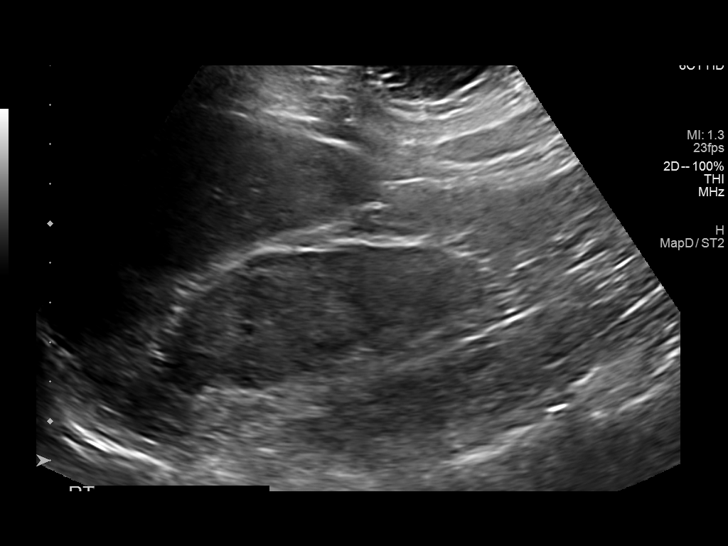
[im 3/34]
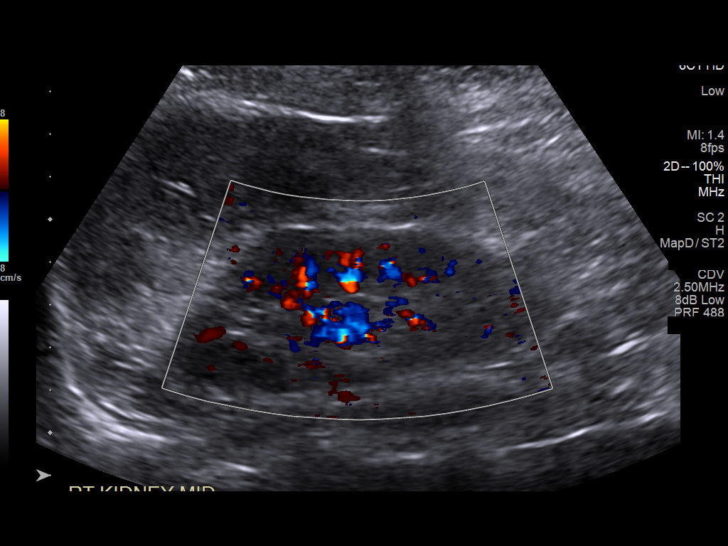
[im 6/34]
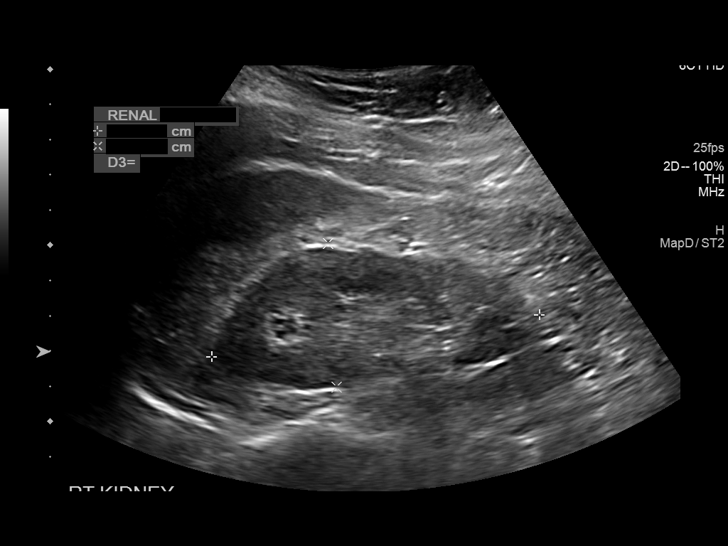
[im 9/34]
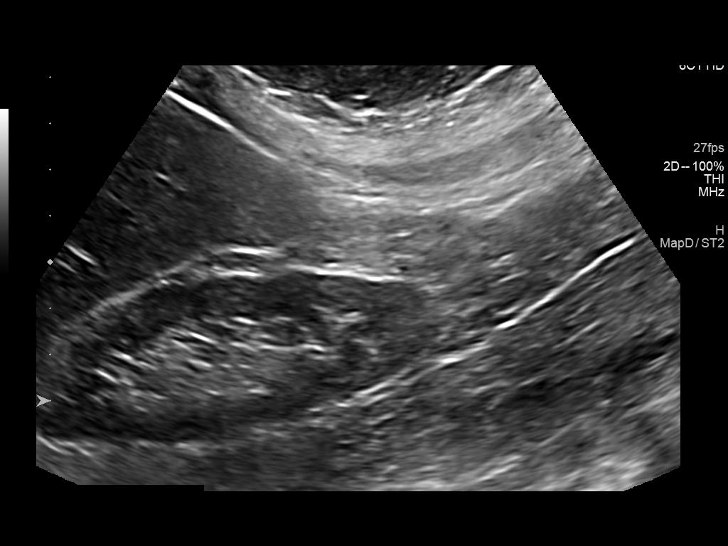
[im 12/34]
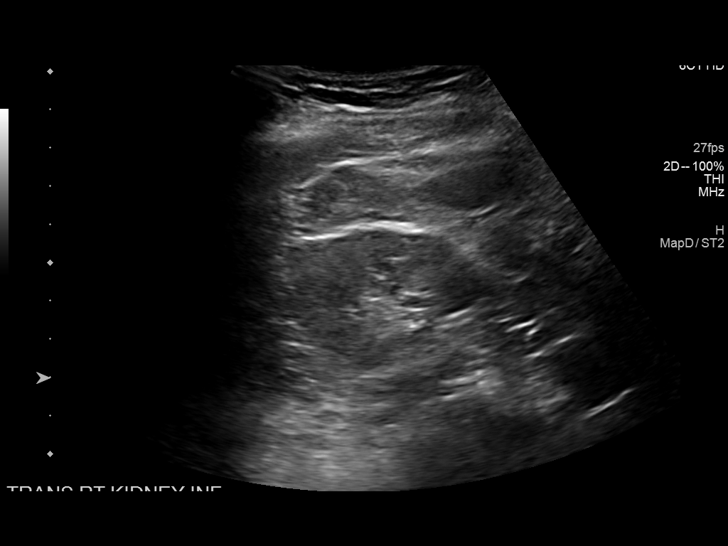
[im 13/34]
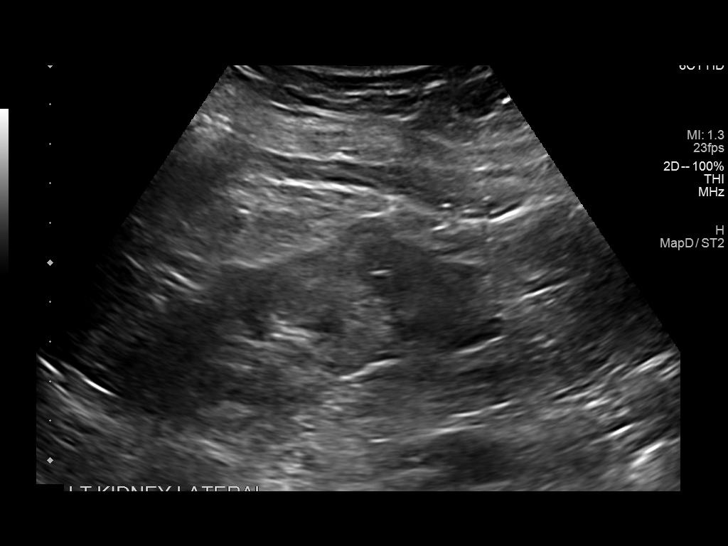
[im 16/34]
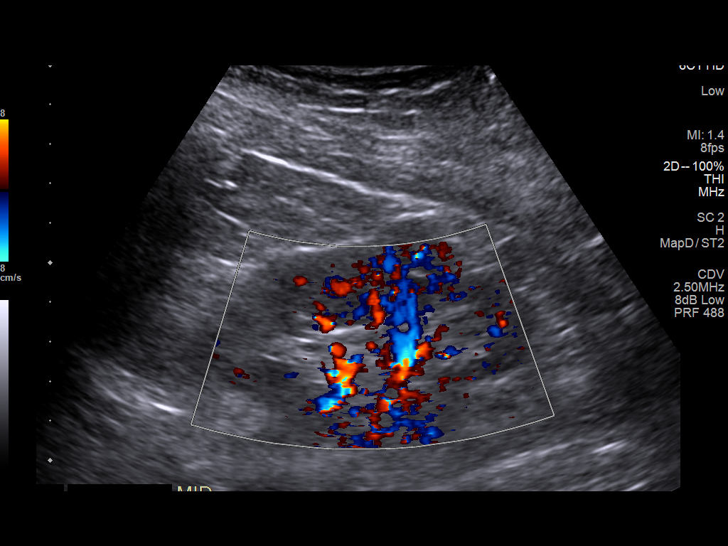
[im 18/34]
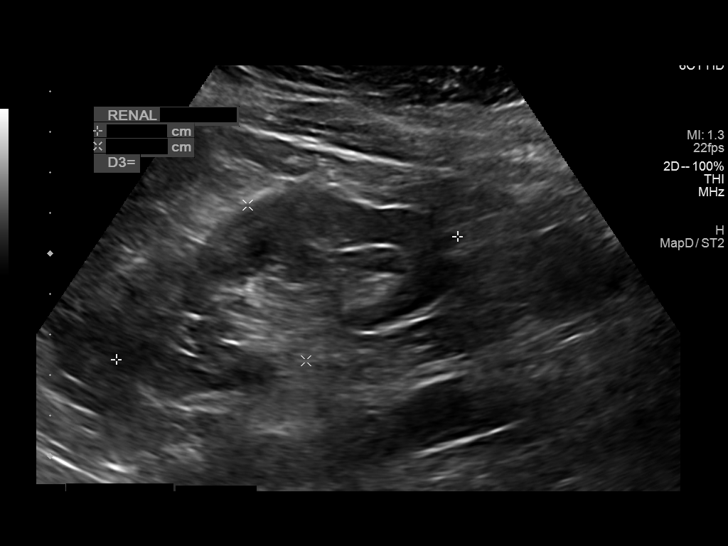
[im 21/34]
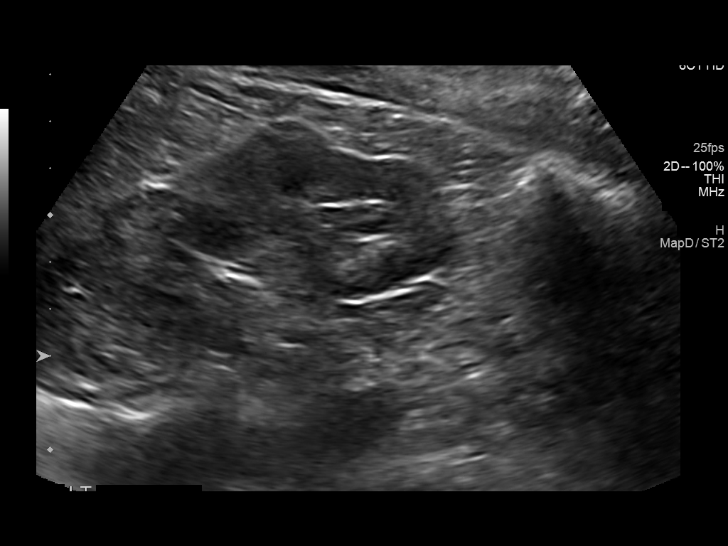
[im 23/34]
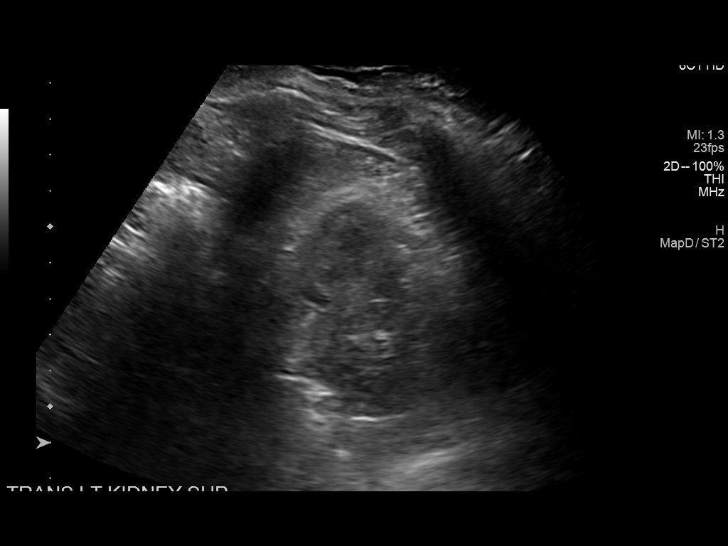
[im 25/34]
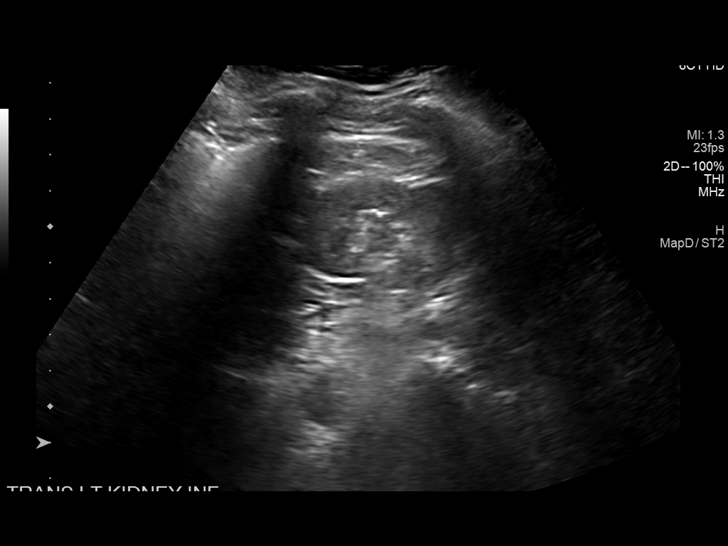
[im 28/34]
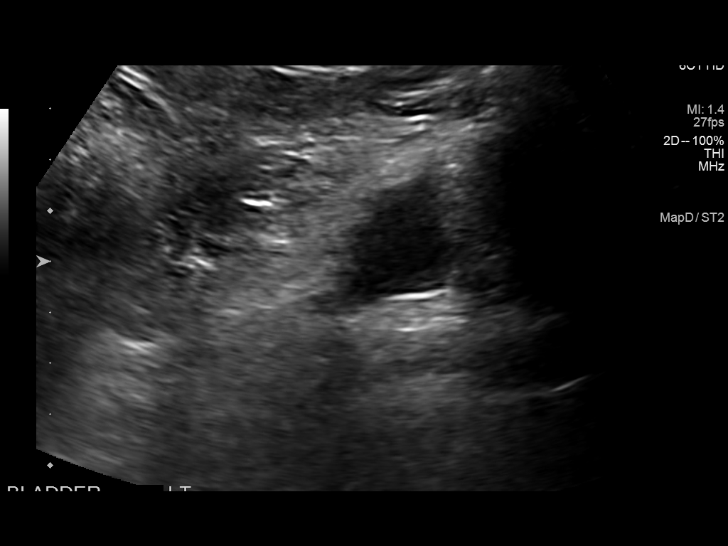
[im 31/34]
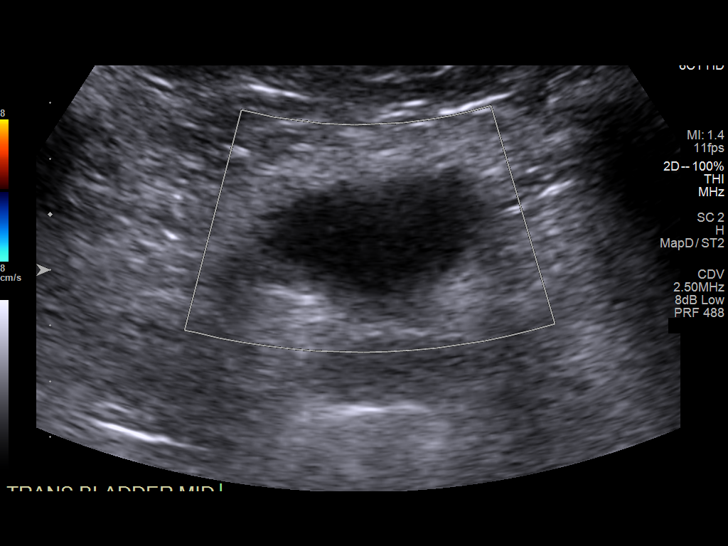
[im 34/34]
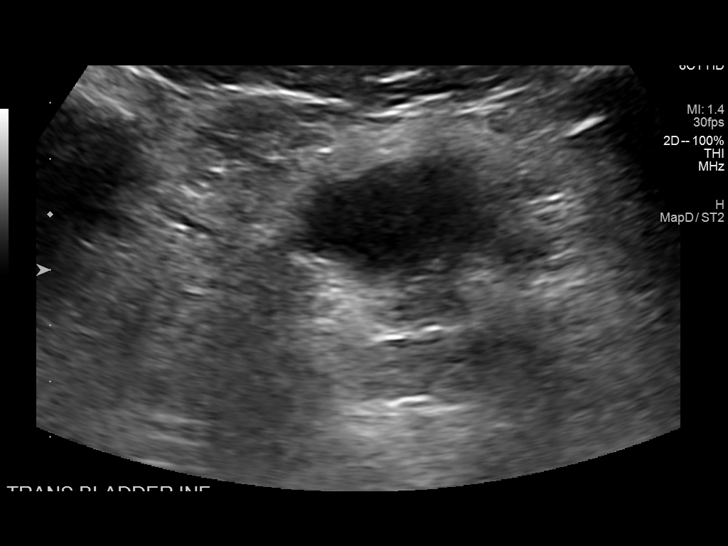

[14 of 25 positions shown; findings below may reference images not displayed]

FINDINGS: Right Kidney:

Renal measurements: 9.4 x 4.1 x 4.6 cm = volume: 92 mL. Echogenicity
within normal limits. No mass or hydronephrosis visualized.

Left Kidney:

Renal measurements: 8.9 x 4.1 x 4.8 cm = volume: 92 mL. Echogenicity
within normal limits. No mass or hydronephrosis visualized.

Bladder:

Poorly distended limiting evaluation.  No gross abnormalities.

Other:

None.
IMPRESSION: No acute abnormalities.  No suspicious masses or obstruction.
